# Patient Record
Sex: Female | Born: 1955 | Hispanic: No | Marital: Married | Smoking: Never smoker
Health system: Southern US, Community
[De-identification: ages and names within clinical notes are randomized; demographics above are authoritative.]

## PROBLEM LIST (undated history)

## (undated) DIAGNOSIS — J45909 Unspecified asthma, uncomplicated: Secondary | ICD-10-CM

## (undated) HISTORY — DX: Unspecified asthma, uncomplicated: J45.909

## (undated) HISTORY — PX: HEMORRHOID SURGERY: SHX153

---

## 2015-06-01 ENCOUNTER — Encounter (HOSPITAL_COMMUNITY): Payer: Self-pay | Admitting: Emergency Medicine

## 2015-06-01 ENCOUNTER — Emergency Department (HOSPITAL_COMMUNITY)
Admission: EM | Admit: 2015-06-01 | Discharge: 2015-06-01 | Disposition: A | Discharge status: Home / Self Care | Payer: PRIVATE HEALTH INSURANCE | Source: Home / Self Care | Attending: Family Medicine | Admitting: Family Medicine

## 2015-06-01 DIAGNOSIS — M25562 Pain in left knee: Secondary | ICD-10-CM

## 2015-06-01 DIAGNOSIS — R42 Dizziness and giddiness: Secondary | ICD-10-CM

## 2015-06-01 DIAGNOSIS — M25561 Pain in right knee: Secondary | ICD-10-CM

## 2015-06-01 LAB — POCT URINALYSIS DIP (DEVICE)
BILIRUBIN URINE: NEGATIVE
GLUCOSE, UA: NEGATIVE mg/dL
Hgb urine dipstick: NEGATIVE
Ketones, ur: NEGATIVE mg/dL
Leukocytes, UA: NEGATIVE
Nitrite: NEGATIVE
Protein, ur: NEGATIVE mg/dL
SPECIFIC GRAVITY, URINE: 1.02 (ref 1.005–1.030)
Urobilinogen, UA: 0.2 mg/dL (ref 0.0–1.0)
pH: 7 (ref 5.0–8.0)

## 2015-06-01 MED ORDER — MECLIZINE HCL 25 MG PO TABS: ORAL_TABLET | ORAL | Status: DC

## 2015-06-01 MED ORDER — ONDANSETRON HCL 4 MG PO TABS: 4.0000 mg | ORAL_TABLET | Freq: Four times a day (QID) | ORAL | Status: AC

## 2015-06-01 MED ORDER — NAPROXEN 375 MG PO TABS: 375.0000 mg | ORAL_TABLET | Freq: Two times a day (BID) | ORAL | Status: DC

## 2015-06-01 NOTE — Discharge Instructions (Signed)
Dizziness  Dizziness means you feel unsteady or lightheaded. You might feel like you are going to pass out (faint). HOME CARE   Drink enough fluids to keep your pee (urine) clear or pale yellow.  Take your medicines exactly as told by your doctor. If you take blood pressure medicine, always stand up slowly from the lying or sitting position. Hold on to something to steady yourself.  If you need to stand in one place for a long time, move your legs often. Tighten and relax your leg muscles.  Have someone stay with you until you feel okay.  Do not drive or use heavy machinery if you feel dizzy.  Do not drink alcohol. GET HELP RIGHT AWAY IF:   You feel dizzy or lightheaded and it gets worse.  You feel sick to your stomach (nauseous), or you throw up (vomit).  You have trouble talking or walking.  You feel weak or have trouble using your arms, hands, or legs.  You cannot think clearly or have trouble forming sentences.  You have chest pain, belly (abdominal) pain, sweating, or you are short of breath.  Your vision changes.  You are bleeding.  You have problems from your medicine that seem to be getting worse. MAKE SURE YOU:   Understand these instructions.  Will watch your condition.  Will get help right away if you are not doing well or get worse. Document Released: 11/16/2011 Document Revised: 02/19/2012 Document Reviewed: 11/16/2011 [facility] Patient Information 2015 [place], [place]. This information is not intended to replace advice given to you by your health care provider. Make sure you discuss any questions you have with your health care provider.  Knee Pain Knee pain can be a result of an injury or other medical conditions. Treatment will depend on the cause of your pain. HOME CARE  Only take medicine as told by your doctor.  Keep a healthy weight. Being overweight can make the knee hurt more.  Stretch before exercising or playing sports.  If there is constant  knee pain, change the way you exercise. Ask your doctor for advice.  Make sure shoes fit well. Choose the right shoe for the sport or activity.  Protect your knees. Wear kneepads if needed.  Rest when you are tired. GET HELP RIGHT AWAY IF:   Your knee pain does not stop.  Your knee pain does not get better.  Your knee joint feels hot to the touch.  You have a fever. MAKE SURE YOU:   Understand these instructions.  Will watch this condition.  Will get help right away if you are not doing well or get worse. Document Released: 02/23/2009 Document Revised: 02/19/2012 Document Reviewed: 02/23/2009 [facility] Patient Information 2015 [place], [place]. This information is not intended to replace advice given to you by your health care provider. Make sure you discuss any questions you have with your health care provider.

## 2015-06-01 NOTE — ED Notes (Signed)
Pt states that she has had back and knee pain for 3 years. [place] is from out [place] she has been in [place] since 05/03/2015. Pt also has ear pain and forgot the name of the medication she uses from her country.

## 2015-06-01 NOTE — ED Provider Notes (Signed)
CSN: [id number]     Arrival date & time 06/01/15  1303 History   First MD Initiated Contact with Patient 06/01/15 1348     Chief Complaint  Patient presents with  . Knee Pain  . Back Pain  . Otalgia   (Consider location/radiation/quality/duration/timing/severity/associated sxs/prior Treatment) HPI Comments: 59 year old female from the [place] arrived in the [place] approximately one month ago. She presents today for medicine refill for her dizziness and nausea that she has had for 3 years. She states that she has approximately 5 episodes in a year and the most recent occurrence was yesterday.  Her second complaint is that of bilateral knee pain for 5 years with a left greater than the right. 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 She denies back pain. Pain is worse with ambulation.   History reviewed. No pertinent past medical history. History reviewed. No pertinent past surgical history. History reviewed. No pertinent family history. History  Substance Use Topics  . Smoking status: Never Smoker   . Smokeless tobacco: Not on file  . Alcohol Use: No   OB History    No data available     Review of Systems  Constitutional: Negative for fever, activity change and fatigue.  HENT: Negative for congestion, ear pain, postnasal drip, rhinorrhea and sore throat.   Eyes: Negative.   Respiratory: Negative for cough and shortness of breath.   Cardiovascular: Negative for chest pain.  Gastrointestinal: Positive for nausea. Negative for vomiting and abdominal pain.  Musculoskeletal: Positive for arthralgias. Negative for back pain and joint swelling.  Skin: Negative.   Neurological: Positive for dizziness. Negative for tremors, syncope, light-headedness and numbness.    Allergies  Review of patient's allergies indicates no known allergies.  Home Medications   Prior to  Admission medications   Medication Sig Start Date End Date Taking? Authorizing Provider  meclizine (ANTIVERT) 25 MG tablet 1/2 tablet up to tid prn dizziness and nausea 06/01/15   Hayden Rasmussen, NP  naproxen (NAPROSYN) 375 MG tablet Take 1 tablet (375 mg total) by mouth 2 (two) times daily. As needed for knee pain. Take with food. 06/01/15   Hayden Rasmussen, NP   BP 140/90 mmHg  Pulse 85  Temp(Src) 97.7 F (36.5 C) (Oral)  Resp 14  SpO2 96% Physical Exam  Constitutional: She appears well-developed and well-nourished. No distress.  HENT:  Bilateral TMs are normal. Oropharynx is clear and without exudate.  Eyes: Conjunctivae and EOM are normal. Pupils are equal, round, and reactive to light.  Neck: Normal range of motion. Neck supple.  Cardiovascular: Normal rate, regular rhythm and normal heart sounds.   Pulmonary/Chest: Effort normal and breath sounds normal. No respiratory distress.  Musculoskeletal:  Bilateral knee exam reveals no swelling, discoloration or deformities. She has full range of motion with extension and flexion. No bony or other tissue. No apparent effusion. No Tenderness along the joint line.  Lymphadenopathy:    She has no cervical adenopathy.  Neurological: She is alert. She exhibits normal muscle tone.  Skin: Skin is warm and dry.  Psychiatric: She has a normal mood and affect.  Nursing note and vitals reviewed.   ED Course  Procedures (including critical care time) Labs Review Labs Reviewed  POCT URINALYSIS DIP (DEVICE)    Imaging Review No results found.   MDM   1. Dizziness   2. Bilateral knee pain    Meclizine  25 mg one half tablet up to 3 times a day when necessary dizziness Naprosyn 375 mg every 12 hours when necessary knee pain. Take with food. Follow-up with [facility] and keep appointment next month. Zofran 4 mg 3 times a day when necessary nausea    Hayden Rasmussen, NP 06/01/15 1407

## 2015-07-07 ENCOUNTER — Ambulatory Visit (INDEPENDENT_AMBULATORY_CARE_PROVIDER_SITE_OTHER): Payer: PRIVATE HEALTH INSURANCE | Admitting: Family Medicine

## 2015-07-07 ENCOUNTER — Encounter: Payer: Self-pay | Admitting: Family Medicine

## 2015-07-07 ENCOUNTER — Other Ambulatory Visit: Payer: Self-pay

## 2015-07-07 VITALS — BP 158/94 | HR 76 | Temp 97.9°F | Resp 14 | Ht 64.0 in | Wt 182.0 lb

## 2015-07-07 DIAGNOSIS — G8929 Other chronic pain: Secondary | ICD-10-CM

## 2015-07-07 DIAGNOSIS — Z7689 Persons encountering health services in other specified circumstances: Secondary | ICD-10-CM | POA: Insufficient documentation

## 2015-07-07 DIAGNOSIS — M2550 Pain in unspecified joint: Secondary | ICD-10-CM

## 2015-07-07 DIAGNOSIS — Z0000 Encounter for general adult medical examination without abnormal findings: Secondary | ICD-10-CM

## 2015-07-07 DIAGNOSIS — R42 Dizziness and giddiness: Secondary | ICD-10-CM | POA: Insufficient documentation

## 2015-07-07 DIAGNOSIS — Z7189 Other specified counseling: Secondary | ICD-10-CM

## 2015-07-07 LAB — CBC WITH DIFFERENTIAL/PLATELET
Basophils Absolute: 0.1 10*3/uL (ref 0.0–0.1)
Basophils Relative: 1 % (ref 0–1)
EOS ABS: 0.5 10*3/uL (ref 0.0–0.7)
EOS PCT: 9 % — AB (ref 0–5)
HCT: 42.2 % (ref 36.0–46.0)
HEMOGLOBIN: 14.3 g/dL (ref 12.0–15.0)
LYMPHS PCT: 47 % — AB (ref 12–46)
Lymphs Abs: 2.5 10*3/uL (ref 0.7–4.0)
MCH: 30 pg (ref 26.0–34.0)
MCHC: 33.9 g/dL (ref 30.0–36.0)
MCV: 88.7 fL (ref 78.0–100.0)
MONO ABS: 0.3 10*3/uL (ref 0.1–1.0)
MPV: 10.7 fL (ref 8.6–12.4)
Monocytes Relative: 6 % (ref 3–12)
Neutro Abs: 2 10*3/uL (ref 1.7–7.7)
Neutrophils Relative %: 37 % — ABNORMAL LOW (ref 43–77)
Platelets: 308 10*3/uL (ref 150–400)
RBC: 4.76 MIL/uL (ref 3.87–5.11)
RDW: 12.9 % (ref 11.5–15.5)
WBC: 5.4 10*3/uL (ref 4.0–10.5)

## 2015-07-07 MED ORDER — MECLIZINE HCL 25 MG PO TABS: ORAL_TABLET | ORAL | Status: AC

## 2015-07-07 MED ORDER — NAPROXEN 375 MG PO TABS: 375.0000 mg | ORAL_TABLET | Freq: Two times a day (BID) | ORAL | Status: DC

## 2015-07-07 MED ORDER — NAPROXEN 375 MG PO TABS: 375.0000 mg | ORAL_TABLET | Freq: Two times a day (BID) | ORAL | Status: AC

## 2015-07-07 MED ORDER — MECLIZINE HCL 25 MG PO TABS
ORAL_TABLET | ORAL | Status: DC
Start: 2015-07-07 — End: 2015-07-07

## 2015-07-07 MED ORDER — BUDESONIDE-FORMOTEROL FUMARATE 160-4.5 MCG/ACT IN AERO: 2.0000 | INHALATION_SPRAY | Freq: Two times a day (BID) | RESPIRATORY_TRACT | Status: AC

## 2015-07-07 NOTE — Progress Notes (Signed)
Patient ID: Haley Wallace, female   DOB: 16-Jul-1956, 59 y.o.   MRN: [id number]   Haley Wallace, is a 59 y.o. female  [id number]  [id number]  DOB - [birth year]  CC:  Chief Complaint  Patient presents with  . Establish Care    asthma, pain in back and knees, rash on skin        HPI: Haley Wallace is a 59 y.o. female here to establish care and receive treatment for chronic back and knee pain. occassional dizziness and asthma. She was seen at urgent care recently and was prescribed naproxen for the musculoskeletal pain and is finding that it is helping with the pain. She reports having intermittent dizziness with head movement. This has been a long term problem. She uses Meclizine to control prn. She is on Symbicort bid for asthma control. She has a history of kidney stones. Otherwise she denies chronic illnesses. Denies a history of hypertension, diabetes, cancer, hyperlipidemia.  No Known Allergies Past Medical History  Diagnosis Date  . Asthma    Current Outpatient Prescriptions on File Prior to Visit  Medication Sig Dispense Refill  . meclizine (ANTIVERT) 25 MG tablet 1/2 tablet up to tid prn dizziness and nausea (Patient not taking: Reported on 07/07/2015) 20 tablet 0  . naproxen (NAPROSYN) 375 MG tablet Take 1 tablet (375 mg total) by mouth 2 (two) times daily. As needed for knee pain. Take with food. (Patient not taking: Reported on 07/07/2015) 20 tablet 0  . ondansetron (ZOFRAN) 4 MG tablet Take 1 tablet (4 mg total) by mouth every 6 (six) hours. (Patient not taking: Reported on 07/07/2015) 12 tablet 0   No current facility-administered medications on file prior to visit.   Family History  Problem Relation Age of Onset  . Cancer Sister    History   Social History  . Marital Status: Married    Spouse Name: N/A  . Number of Children: N/A  . Years of Education: N/A   Occupational History  . Not on file.   Social History Main Topics  . Smoking status: Never Smoker   .  Smokeless tobacco: Never Used  . Alcohol Use: No  . Drug Use: No  . Sexual Activity: Not on file   Other Topics Concern  . Not on file   Social History Narrative    Review of Systems: Constitutional: Negative for fever, chills, appetite change, weight loss. Positive for fatigue. Skin: positive for intermittent rash for last 3 years. Last episode 4 months ago. HENT: Negative  ear discharge.nose bleeds. Positive for intermittent ear discomfort. Eyes: Negative for pain, discharge, redness, itching and visual disturbance.Positive for frequent tearing. Wears glasses Neck: Negative for pain, stiffness Respiratory: Positive for cough, shortness of breath,  Wheezing related to asthma. Use symbicort bid Cardiovascular: Negative for chest pain, palpitations and leg swelling. Gastrointestinal: Negative for abdominal distention, abdominal pain, nausea, vomiting, diarrhea, constipations Genitourinary: Negative for dysuria, urgency, frequency, hematuria, flank pain,  Musculoskeletal: Positive for back pain, knee pain, arthralgia and Negative for weakness. Neurological: Negative for tremors, seizures, syncope,   light-headedness, numbness and headaches. Positive for intermittent dizziness with head movement, use Meclizine Hematological: Negative for easy bruising or bleeding Psychiatric/Behavioral: Negative for depression, anxiety, decreased concentration, confusion    Objective:   Filed Vitals:   07/07/15 1030  BP: 151/81  Pulse: 76  Temp: 97.9 F (36.6 C)  Resp: 14    Physical Exam: Constitutional: Patient appears well-developed and well-nourished. No distress. HENT: Normocephalic, atraumatic, External right and  left ear normal. Oropharynx is clear and moist.  Eyes: Conjunctivae and EOM are normal. PERRLA, no scleral icterus. Neck: Normal ROM. Neck supple. No lymphadenopathy, No thyromegaly. CVS: RRR, S1/S2 +, no murmurs, no gallops, no rubs Pulmonary: Effort and breath sounds normal,  no stridor, rhonchi, wheezes, rales.  Abdominal: Soft. Normoactive BS,, no distension, tenderness, rebound or guarding.  Musculoskeletal: Normal range of motion. No edema and no tenderness.  Neuro: Alert.Normal muscle tone coordination. Non-focal Skin: Skin is warm and dry. No rash noted. Not diaphoretic. No erythema. No pallor. Psychiatric: Normal mood and affect. Behavior, judgment, thought content normal.  No results found for: WBC, HGB, HCT, MCV, PLT No results found for: CREATININE, BUN, NA, K, CL, CO2  No results found for: HGBA1C Lipid Panel  No results found for: CHOL, TRIG, HDL, CHOLHDL, VLDL, LDLCALC     Assessment and plan:   Visit to establish care -Review of PFSH -review of records from urgent care -Cmet with GFR, CBC, lipid panel, TSH. Will call if shows anything needing treatment. -discussion of needed health maintenance. Will have her return for further discussion of these issues  Dizziness -Refill of Meclizine 25 mg #30 to be used 1/2-1 prn prolonged dizziness  Back and knee pain, chronic, improving with naproxen -Refill of naproxen 325 mg #60 one pos bid with food.  Elevated blood pressure, 151/94 today, 140/90 in urgent care within the last month. -Will have her return in one week for nurse visit for BP check  Follow-up one week for BP check with nurse. Follow-up in one-2 months for further discussion of health maintenance and PAP.  The patient was given clear instructions to go to ER or return to medical center if symptoms don't improve, worsen or new problems develop. The patient verbalized understanding. The patient was told to call to get lab results if they haven't heard anything in the next week.       Henrietta Hoover, MSN, FNP-BC   07/07/2015, 10:43 AM

## 2015-07-07 NOTE — Telephone Encounter (Signed)
Re-sent medications into correct pharmacy. Thanks!

## 2015-07-07 NOTE — Patient Instructions (Signed)
Continue medications as they are I have provided refills on symbicort, naproxen and meclizine today. We will call if there is anything needing treatment in your bloodwork Follow-up in six months and as needed.

## 2015-07-08 LAB — COMPLETE METABOLIC PANEL WITH GFR
ALBUMIN: 4.2 g/dL (ref 3.6–5.1)
ALT: 16 U/L (ref 6–29)
AST: 16 U/L (ref 10–35)
Alkaline Phosphatase: 62 U/L (ref 33–130)
BILIRUBIN TOTAL: 0.4 mg/dL (ref 0.2–1.2)
BUN: 9 mg/dL (ref 7–25)
CALCIUM: 9.5 mg/dL (ref 8.6–10.4)
CHLORIDE: 100 meq/L (ref 98–110)
CO2: 23 meq/L (ref 20–31)
Creat: 0.66 mg/dL (ref 0.50–0.99)
GFR, Est African American: >89 mL/min (ref >=60)
GFR, Est Non African American: >89 mL/min (ref >=60)
GLUCOSE: 90 mg/dL (ref 65–99)
POTASSIUM: 4.6 meq/L (ref 3.5–5.3)
SODIUM: 140 meq/L (ref 135–146)
TOTAL PROTEIN: 7.2 g/dL (ref 6.1–8.1)

## 2015-07-08 LAB — LIPID PANEL
CHOL/HDL RATIO: 5.8 ratio — AB (ref <=5.0)
Cholesterol: 231 mg/dL — ABNORMAL HIGH (ref 125–200)
HDL: 40 mg/dL — AB (ref >=46)
LDL CALC: 150 mg/dL — AB (ref <130)
Triglycerides: 204 mg/dL — ABNORMAL HIGH (ref <150)
VLDL: 41 mg/dL — AB (ref <30)

## 2015-07-08 LAB — TSH: TSH: 0.582 u[IU]/mL (ref 0.350–4.500)

## 2015-07-14 ENCOUNTER — Other Ambulatory Visit: Payer: PRIVATE HEALTH INSURANCE

## 2015-09-22 ENCOUNTER — Encounter (HOSPITAL_COMMUNITY): Payer: Self-pay | Admitting: Emergency Medicine

## 2015-09-22 ENCOUNTER — Emergency Department (HOSPITAL_COMMUNITY): Payer: PRIVATE HEALTH INSURANCE

## 2015-09-22 ENCOUNTER — Emergency Department (HOSPITAL_COMMUNITY)
Admission: EM | Admit: 2015-09-22 | Discharge: 2015-09-22 | Disposition: A | Discharge status: Home / Self Care | Payer: PRIVATE HEALTH INSURANCE | Attending: Emergency Medicine | Admitting: Emergency Medicine

## 2015-09-22 DIAGNOSIS — Y9389 Activity, other specified: Secondary | ICD-10-CM | POA: Insufficient documentation

## 2015-09-22 DIAGNOSIS — S52592A Other fractures of lower end of left radius, initial encounter for closed fracture: Secondary | ICD-10-CM | POA: Insufficient documentation

## 2015-09-22 DIAGNOSIS — Y92000 Kitchen of unspecified non-institutional (private) residence as  the place of occurrence of the external cause: Secondary | ICD-10-CM | POA: Insufficient documentation

## 2015-09-22 DIAGNOSIS — J45909 Unspecified asthma, uncomplicated: Secondary | ICD-10-CM | POA: Insufficient documentation

## 2015-09-22 DIAGNOSIS — Z791 Long term (current) use of non-steroidal anti-inflammatories (NSAID): Secondary | ICD-10-CM | POA: Insufficient documentation

## 2015-09-22 DIAGNOSIS — S52502A Unspecified fracture of the lower end of left radius, initial encounter for closed fracture: Secondary | ICD-10-CM

## 2015-09-22 DIAGNOSIS — W010XXA Fall on same level from slipping, tripping and stumbling without subsequent striking against object, initial encounter: Secondary | ICD-10-CM | POA: Insufficient documentation

## 2015-09-22 DIAGNOSIS — Y998 Other external cause status: Secondary | ICD-10-CM | POA: Insufficient documentation

## 2015-09-22 MED ORDER — HYDROCODONE-ACETAMINOPHEN 5-325 MG PO TABS: 1.0000 | ORAL_TABLET | Freq: Four times a day (QID) | ORAL | Status: AC | PRN

## 2015-09-22 MED ORDER — HYDROCODONE-ACETAMINOPHEN 5-325 MG PO TABS
1.0000 | ORAL_TABLET | Freq: Once | ORAL | Status: AC
  Administered 2015-09-22: 1 via ORAL
  Filled 2015-09-22: qty 1

## 2015-09-22 NOTE — ED Provider Notes (Signed)
CSN: [id number]     Arrival date & time 09/22/15  2030 History  By signing my name below, I, Budd Palmer, attest that this documentation has been prepared under the direction and in the presence of [employer], PA-C. Electronically Signed: Budd Palmer, ED Scribe. 09/22/2015. 9:45 PM.     Chief Complaint  Patient presents with  . Wrist Pain   The history is provided by the patient and a relative. No language interpreter was used.   HPI Comments: Haley Wallace is a 59 y.o. female who presents to the Emergency Department complaining of constant, aching, left wrist pain onset after slipping on the kitchen floor and landing on her wrist just PTA. She reports associated swelling. Per relative, pt denies any other injuries.  Past Medical History  Diagnosis Date  . Asthma    Past Surgical History  Procedure Laterality Date  . Hemorrhoid surgery     Family History  Problem Relation Age of Onset  . Cancer Sister    Social History  Substance Use Topics  . Smoking status: Never Smoker   . Smokeless tobacco: Never Used  . Alcohol Use: No   OB History    No data available     Review of Systems  Constitutional: Negative for fever.  Musculoskeletal: Positive for joint swelling and arthralgias.  Skin: Negative for wound.    Allergies  Review of patient's allergies indicates no known allergies.  Home Medications   Prior to Admission medications   Medication Sig Start Date End Date Taking? Authorizing Provider  budesonide-formoterol (SYMBICORT) 160-4.5 MCG/ACT inhaler Inhale 2 puffs into the lungs 2 (two) times daily. 07/07/15   Henrietta Hoover, NP  meclizine (ANTIVERT) 25 MG tablet 1/2 tablet up to tid prn dizziness and nausea 07/07/15   Henrietta Hoover, NP  naproxen (NAPROSYN) 375 MG tablet Take 1 tablet (375 mg total) by mouth 2 (two) times daily. As needed for knee pain. Take with food. 07/07/15   Henrietta Hoover, NP  ondansetron (ZOFRAN) 4 MG tablet Take 1 tablet  (4 mg total) by mouth every 6 (six) hours. Patient not taking: Reported on 07/07/2015 06/01/15   Hayden Rasmussen, NP   BP 120/68 mmHg  Pulse 82  Temp(Src) 98.1 F (36.7 C) (Oral)  Resp 14  SpO2 100% Physical Exam  Constitutional: She is oriented to person, place, and time. She appears well-developed and well-nourished.  HENT:  Head: Normocephalic.  Eyes: EOM are normal.  Neck: Normal range of motion.  Pulmonary/Chest: Effort normal.  Abdominal: She exhibits no distension.  Musculoskeletal: Normal range of motion.  Swelling and deformity noted to the left dorsal wrist. ttp over medial aspect of the wrist. Normal hand exam. Pain with any rom. Radial pulse intact. Normal elbow exam. Normal fingers  Neurological: She is alert and oriented to person, place, and time.  Psychiatric: She has a normal mood and affect.  Nursing note and vitals reviewed.   ED Course  Procedures  DIAGNOSTIC STUDIES: Oxygen Saturation is 100% on RA, normal by my interpretation.    COORDINATION OF CARE: 9:43 PM - Discussed XR results of wrist fracture. Discussed plans to splint the wrist and order pain medication. Will refer to an orthopedist. Pt advised of plan for treatment and pt agrees.  Labs Review Labs Reviewed - No data to display  Imaging Review Dg Wrist Complete Left  09/22/2015  CLINICAL DATA:  59 year old female with acute left wrist pain following fall today. Initial encounter. EXAM: LEFT WRIST -  COMPLETE 3+ VIEW COMPARISON:  None. FINDINGS: A comminuted distal radial fracture is identified with probable intra-articular extension. There is no evidence of subluxation or dislocation. No other fractures are identified. Soft tissue swelling is present. IMPRESSION: Comminuted distal radial fracture with probable intra-articular extension. No evidence of subluxation or dislocation. Electronically Signed   By: Harmon PierJeffrey  Hu M.D.   On: 09/22/2015 21:31   I have personally reviewed and evaluated these images and  lab results as part of my medical decision-making.   EKG Interpretation None      MDM   Final diagnoses:  Distal radius fracture, left, closed, initial encounter    patient with left wrist injury. No elbow or hand injury. X-rays show a comminuted distal radial fracture. Neurovascularly intact. Patient placed in volar splint.  discuss treatment plan with Dr. Freida BusmanAllen who agrees, home with pain management and orthopedics follow-up.   Filed Vitals:   09/22/15 2046 09/22/15 2237  BP: 120/68 122/67  Pulse: 82 83  Temp: 98.1 F (36.7 C) 98.3 F (36.8 C)  TempSrc: Oral Oral  Resp: 14 16  SpO2: 100% 99%    I personally performed the services described in this documentation, which was scribed in my presence. The recorded information has been reviewed and is accurate.   Jaynie Crumbleatyana Yaron Grasse, PA-C 09/22/15 2314  Lorre NickAnthony Allen, MD 09/22/15 2317

## 2015-09-22 NOTE — Discharge Instructions (Signed)
Ibuprofen or Tylenol for pain. Norco for severe pain only. Ice and elevate the wrist. Keep splint on at all times. Follow-up with orthopedic specialist as referred.  Wrist Fracture A wrist fracture is a break or crack in one of the bones of your wrist. Your wrist is made up of eight small bones at the palm of your hand (carpal bones) and two long bones that make up your forearm (radius and ulna). CAUSES  A direct blow to the wrist.  Falling on an outstretched hand.  Trauma, such as a car accident or a fall. RISK FACTORS Risk factors for wrist fracture include:  Participating in contact and high-risk sports, such as skiing, biking, and ice skating.  Taking steroid medicines.  Smoking.  Being female.  Being Caucasian.  Drinking more than three alcoholic beverages per day.  Having low or lowered bone density (osteoporosis or osteopenia).  Age. Older adults have decreased bone density.  Women who have had menopause.  History of previous fractures. SIGNS AND SYMPTOMS Symptoms of wrist fractures include tenderness, bruising, and inflammation. Additionally, the wrist may hang in an odd position or appear deformed. DIAGNOSIS Diagnosis may include:  Physical exam.  X-ray. TREATMENT Treatment depends on many factors, including the nature and location of the fracture, your age, and your activity level. Treatment for wrist fracture can be nonsurgical or surgical. Nonsurgical Treatment A plaster cast or splint may be applied to your wrist if the bone is in a good position. If the fracture is not in good position, it may be necessary for your health care provider to realign it before applying a splint or cast. Usually, a cast or splint will be worn for several weeks. Surgical Treatment Sometimes the position of the bone is so far out of place that surgery is required to apply a device to hold it together as it heals. Depending on the fracture, there are a number of options for holding  the bone in place while it heals, such as a cast and metal pins. HOME CARE INSTRUCTIONS  Keep your injured wrist elevated and move your fingers as much as possible.  Do not put pressure on any part of your cast or splint. It may break.  Use a plastic bag to protect your cast or splint from water while bathing or showering. Do not lower your cast or splint into water.  Take medicines only as directed by your health care provider.  Keep your cast or splint clean and dry. If it becomes wet, damaged, or suddenly feels too tight, contact your health care provider right away.  Do not use any tobacco products including cigarettes, chewing tobacco, or electronic cigarettes. Tobacco can delay bone healing. If you need help quitting, ask your health care provider.  Keep all follow-up visits as directed by your health care provider. This is important.  Ask your health care provider if you should take supplements of calcium and vitamins C and D to promote bone healing. SEEK MEDICAL CARE IF:  Your cast or splint is damaged, breaks, or gets wet.  You have a fever.  You have chills.  You have continued severe pain or more swelling than you did before the cast was put on. SEEK IMMEDIATE MEDICAL CARE IF:  Your hand or fingernails on the injured arm turn blue or gray, or feel cold or numb.  You have decreased feeling in the fingers of your injured arm. MAKE SURE YOU:  Understand these instructions.  Will watch your condition.  Will  get help right away if you are not doing well or get worse.   This information is not intended to replace advice given to you by your health care provider. Make sure you discuss any questions you have with your health care provider.   Document Released: 09/06/2005 Document Revised: 08/18/2015 Document Reviewed: 12/15/2011 Elsevier Interactive Patient Education [employer].

## 2015-09-22 NOTE — ED Notes (Signed)
Ortho tech paged for arm splint.

## 2015-09-22 NOTE — Progress Notes (Signed)
Orthopedic Tech Progress Note Patient Details:  Elesa HackerHikmat Chestang 01/08/1956 [id number]  Ortho Devices Type of Ortho Device: Ace wrap, Volar splint Ortho Device/Splint Location: LUE Ortho Device/Splint Interventions: Ordered, Application   Jennye MoccasinHughes, Pura Picinich Craig 09/22/2015, 10:17 PM

## 2015-09-22 NOTE — ED Notes (Addendum)
C/o L wrist pain and swelling.  Family member states she slipped in kitchen earlier tonight.  CMS intact.  Denies LOC.  Denies neck and back pain.

## 2015-10-15 ENCOUNTER — Encounter (HOSPITAL_COMMUNITY): Payer: Self-pay | Admitting: *Deleted

## 2015-10-15 ENCOUNTER — Emergency Department (HOSPITAL_COMMUNITY)
Admission: EM | Admit: 2015-10-15 | Discharge: 2015-10-15 | Disposition: A | Discharge status: Home / Self Care | Payer: PRIVATE HEALTH INSURANCE | Attending: Emergency Medicine | Admitting: Emergency Medicine

## 2015-10-15 DIAGNOSIS — R110 Nausea: Secondary | ICD-10-CM | POA: Insufficient documentation

## 2015-10-15 DIAGNOSIS — S52592D Other fractures of lower end of left radius, subsequent encounter for closed fracture with routine healing: Secondary | ICD-10-CM | POA: Insufficient documentation

## 2015-10-15 DIAGNOSIS — M25532 Pain in left wrist: Secondary | ICD-10-CM

## 2015-10-15 DIAGNOSIS — J45909 Unspecified asthma, uncomplicated: Secondary | ICD-10-CM | POA: Insufficient documentation

## 2015-10-15 DIAGNOSIS — S52502D Unspecified fracture of the lower end of left radius, subsequent encounter for closed fracture with routine healing: Secondary | ICD-10-CM

## 2015-10-15 DIAGNOSIS — Z7951 Long term (current) use of inhaled steroids: Secondary | ICD-10-CM | POA: Insufficient documentation

## 2015-10-15 DIAGNOSIS — X58XXXD Exposure to other specified factors, subsequent encounter: Secondary | ICD-10-CM | POA: Insufficient documentation

## 2015-10-15 MED ORDER — ONDANSETRON HCL 8 MG PO TABS: 8.0000 mg | ORAL_TABLET | Freq: Three times a day (TID) | ORAL | Status: AC | PRN

## 2015-10-15 MED ORDER — HYDROCODONE-ACETAMINOPHEN 5-325 MG PO TABS: 1.0000 | ORAL_TABLET | Freq: Four times a day (QID) | ORAL | Status: AC | PRN

## 2015-10-15 MED ORDER — ONDANSETRON 4 MG PO TBDP
8.0000 mg | ORAL_TABLET | Freq: Once | ORAL | Status: AC
  Administered 2015-10-15: 8 mg via ORAL
  Filled 2015-10-15: qty 2

## 2015-10-15 MED ORDER — NAPROXEN 500 MG PO TABS: 500.0000 mg | ORAL_TABLET | Freq: Two times a day (BID) | ORAL | Status: AC | PRN

## 2015-10-15 NOTE — ED Notes (Signed)
Per pt and family, pt was here on 10/12 for injury to left wrist. Pt had splint applied and referred to ortho. Pt went to clinic instead and was told to keep splint on and return to ED if any increase in pain or swelling. Reports increase in pain for several days and no relief with pains meds prescribed.

## 2015-10-15 NOTE — Discharge Instructions (Signed)
Wear wrist splint until you see the orthopedist. Ice and elevate wrist throughout the day. Alternate between naprosyn and norco for pain relief. Do not drive or operate machinery with pain medication use. Use zofran as needed for nausea. Call orthopedic follow up today or tomorrow to schedule followup appointment for ongoing management of your wrist fracture. Return to the ER for changes or worsening symptoms.    Wrist Pain There are many things that can cause wrist pain. Some common causes include:  An injury to the wrist area.  Overuse of the joint.  A condition that causes too much pressure to be put on a nerve in the wrist (carpal tunnel syndrome).  Wear and tear of the joints that happens as a person gets older (osteoarthritis).  Other types of arthritis. Sometimes, the cause is not known. The pain often goes away when you follow instructions from your doctor about relieving pain at home. If your wrist pain does not go away, tests may need to be done to find the cause. HOME CARE Pay attention to any changes in your symptoms. Take these actions to help with your pain:  Rest your wrist for at least 48 hours or as told by your doctor.  If your doctor tells you to, put ice on the injured area:  Put ice in a plastic bag.  Place a towel between your skin and the bag.  Leave the ice on for 20 minutes, 2-3 times per day.  Keep your arm raised (elevated) above the level of your heart while you are sitting or lying down.  If a splint or elastic bandage has been put on the injured area:  Wear it as told by your doctor.  Take the splint or bandage off only as told by your doctor.  Loosen the splint or bandage if your fingers lose feeling (are numb) or have a tingling feeling, or if they turn cold or blue.  Take over-the-counter and prescription medicines only as told by your doctor.  Keep all follow-up visits as told by your doctor. This is important. GET HELP IF:  Your pain is  not helped by treatment.  Your pain gets worse. GET HELP RIGHT AWAY IF:   Your fingers swell.  Your fingers turn white, very red, or cold and blue.  Your fingers lose feeling or have a tingling feeling.  You have trouble moving your fingers.   This information is not intended to replace advice given to you by your health care provider. Make sure you discuss any questions you have with your health care provider.   Document Released: 05/15/2008 Document Revised: 08/18/2015 Document Reviewed: 04/14/2015 Elsevier Interactive Patient Education 2016 [employer]  Nausea, Adult Nausea means you feel sick to your stomach or need to throw up (vomit). It may be a sign of a more serious problem. If nausea gets worse, you may throw up. If you throw up a lot, you may lose too much body fluid (dehydration). HOME CARE   Get plenty of rest.  Ask your doctor how to replace body fluid losses (rehydrate).  Eat small amounts of food. Sip liquids more often.  Take all medicines as told by your doctor. GET HELP RIGHT AWAY IF:  You have a fever.  You pass out (faint).  You keep throwing up or have blood in your throw up.  You are very weak, have dry lips or a dry mouth, or you are very thirsty (dehydrated).  You have dark or bloody poop (stool).  You have very bad chest or belly (abdominal) pain.  You do not get better after 2 days, or you get worse.  You have a headache. MAKE SURE YOU:  Understand these instructions.  Will watch your condition.  Will get help right away if you are not doing well or get worse.   This information is not intended to replace advice given to you by your health care provider. Make sure you discuss any questions you have with your health care provider.   Document Released: 11/16/2011 Document Revised: 02/19/2012 Document Reviewed: 11/16/2011 Elsevier Interactive Patient Education 2016 [employer] Inc[employer]  Cryotherapy Cryotherapy is when you put ice on  your injury. Ice helps lessen pain and puffiness (swelling) after an injury. Ice works the best when you start using it in the first 24 to 48 hours after an injury. HOME CARE  Put a dry or damp towel between the ice pack and your skin.  You may press gently on the ice pack.  Leave the ice on for no more than 10 to 20 minutes at a time.  Check your skin after 5 minutes to make sure your skin is okay.  Rest at least 20 minutes between ice pack uses.  Stop using ice when your skin loses feeling (numbness).  Do not use ice on someone who cannot tell you when it hurts. This includes small children and people with memory problems (dementia). GET HELP RIGHT AWAY IF:  You have white spots on your skin.  Your skin turns blue or pale.  Your skin feels waxy or hard.  Your puffiness gets worse. MAKE SURE YOU:   Understand these instructions.  Will watch your condition.  Will get help right away if you are not doing well or get worse.   This information is not intended to replace advice given to you by your health care provider. Make sure you discuss any questions you have with your health care provider.   Document Released: 05/15/2008 Document Revised: 02/19/2012 Document Reviewed: 07/20/2011 Elsevier Interactive Patient Education [employer]

## 2015-10-15 NOTE — Progress Notes (Signed)
Orthopedic Tech Progress Note Patient Details:  Haley Wallace 07/06/1956 [id number]  Ortho Devices Type of Ortho Device: Ace wrap, Arm sling, Sugartong splint Ortho Device/Splint Location: LUE Ortho Device/Splint Interventions: Ordered, Application   Jennye MoccasinHughes, [name] 10/15/2015, 4:45 PM

## 2015-10-15 NOTE — ED Provider Notes (Signed)
CSN: [id number]     Arrival date & time 10/15/15  1433 History   First MD Initiated Contact with Patient 10/15/15 1609     Chief Complaint  Patient presents with  . Hand Pain     (Consider location/radiation/quality/duration/timing/severity/associated sxs/prior Treatment) HPI Comments: Haley Wallace is a 59 y.o. female with a PMHx of asthma, who presents to the ED with complaints of ongoing left wrist pain since she slipped and fell on her kitchen floor on 09/22/15. Patient's son provides most of the history and translates for the patient. He reports that after they were discharged, they were told to follow-up with an orthopedist, but due to the fact that the patient has no insurance and has a orange card, they went to her regular doctor to get a referral to another orthopedist, and when they called.orthopedic doctor they were told that there were no appointments until January. Patient's son reports that the patient has been complaining of pain since the injury. She is present pain is 7/10 constant sharp left wrist pain nonradiating worse with pronation and supination and improved with the norco given that her last ER visit, but she has run out of this medication. She reports some mild nausea that comes with the pain.  She denies any fevers, chills, chest pain, shortness breath, abdominal pain, vomiting, diarrhea, constipation, dysuria, hematuria, numbness, tingling, weakness, or worsening swelling. She denies any skin changes.  Patient is a 59 y.o. female presenting with hand pain. The history is provided by the patient. A language interpreter was used (pt's son).  Hand Pain This is a recurrent problem. The current episode started 1 to 4 weeks ago. The problem occurs constantly. The problem has been unchanged. Associated symptoms include arthralgias (L wrist) and nausea (due to pain). Pertinent negatives include no abdominal pain, chest pain, chills, fever, joint swelling, myalgias, numbness,  vomiting or weakness. Exacerbated by: supination of wrist. She has tried oral narcotics for the symptoms. The treatment provided moderate relief.    Past Medical History  Diagnosis Date  . Asthma    Past Surgical History  Procedure Laterality Date  . Hemorrhoid surgery     Family History  Problem Relation Age of Onset  . Cancer Sister    Social History  Substance Use Topics  . Smoking status: Never Smoker   . Smokeless tobacco: Never Used  . Alcohol Use: No   OB History    No data available     Review of Systems  Constitutional: Negative for fever and chills.  Respiratory: Negative for shortness of breath.   Cardiovascular: Negative for chest pain.  Gastrointestinal: Positive for nausea (due to pain). Negative for vomiting, abdominal pain, diarrhea and constipation.  Genitourinary: Negative for dysuria and hematuria.  Musculoskeletal: Positive for arthralgias (L wrist). Negative for myalgias and joint swelling.  Skin: Negative for color change.  Allergic/Immunologic: Negative for immunocompromised state.  Neurological: Negative for weakness and numbness.  Psychiatric/Behavioral: Negative for confusion.   10 Systems reviewed and are negative for acute change except as noted in the HPI.    Allergies  Review of patient's allergies indicates no known allergies.  Home Medications   Prior to Admission medications   Medication Sig Start Date End Date Taking? Authorizing Provider  budesonide-formoterol (SYMBICORT) 160-4.5 MCG/ACT inhaler Inhale 2 puffs into the lungs 2 (two) times daily. 07/07/15   Henrietta Hoover, NP  HYDROcodone-acetaminophen (NORCO) 5-325 MG tablet Take 1 tablet by mouth every 6 (six) hours as needed for moderate pain.  09/22/15   Tatyana Kirichenko, PA-C  meclizine (ANTIVERT) 25 MG tablet 1/2 tablet up to tid prn dizziness and nausea 07/07/15   Henrietta HooverLinda C Bernhardt, NP  naproxen (NAPROSYN) 375 MG tablet Take 1 tablet (375 mg total) by mouth 2 (two) times  daily. As needed for knee pain. Take with food. 07/07/15   Henrietta HooverLinda C Bernhardt, NP  ondansetron (ZOFRAN) 4 MG tablet Take 1 tablet (4 mg total) by mouth every 6 (six) hours. Patient not taking: Reported on 07/07/2015 06/01/15   Hayden Rasmussenavid Mabe, NP   BP 120/70 mmHg  Pulse 87  Temp(Src) 98.1 F (36.7 C) (Oral)  Resp 18  SpO2 99% Physical Exam  Constitutional: She is oriented to person, place, and time. Vital signs are normal. She appears well-developed and well-nourished.  Non-toxic appearance. No distress.  Afebrile, nontoxic, NAD  HENT:  Head: Normocephalic and atraumatic.  Mouth/Throat: Oropharynx is clear and moist and mucous membranes are normal.  Eyes: Conjunctivae and EOM are normal. Right eye exhibits no discharge. Left eye exhibits no discharge.  Neck: Normal range of motion. Neck supple.  Cardiovascular: Normal rate, regular rhythm, normal heart sounds and intact distal pulses.  Exam reveals no gallop and no friction rub.   No murmur heard. Pulmonary/Chest: Effort normal and breath sounds normal. No respiratory distress. She has no decreased breath sounds. She has no wheezes. She has no rhonchi. She has no rales.  Abdominal: Soft. Normal appearance and bowel sounds are normal. She exhibits no distension. There is no tenderness. There is no rigidity, no rebound, no guarding, no CVA tenderness, no tenderness at McBurney's point and negative Murphy's sign.  Musculoskeletal:       Left wrist: She exhibits decreased range of motion (due to pain). She exhibits no tenderness, no bony tenderness, no swelling, no deformity and no laceration.  L wrist with minimally decreased ROM due to pain particularly with supination, with no focal areas of TTP, no swelling or bruising, no deformity, skin intact, with strength and sensation grossly intact, distal pulses intact. Wiggles all digits with ease.   Neurological: She is alert and oriented to person, place, and time. She has normal strength. No sensory  deficit.  Skin: Skin is warm, dry and intact. No rash noted.  Psychiatric: She has a normal mood and affect.  Nursing note and vitals reviewed.   ED Course  Procedures (including critical care time) Labs Review Labs Reviewed - No data to display  Imaging Review No results found.  Dg Wrist Complete Left  09/22/2015  CLINICAL DATA:  59 year old female with acute left wrist pain following fall today. Initial encounter. EXAM: LEFT WRIST - COMPLETE 3+ VIEW COMPARISON:  None. FINDINGS: A comminuted distal radial fracture is identified with probable intra-articular extension. There is no evidence of subluxation or dislocation. No other fractures are identified. Soft tissue swelling is present. IMPRESSION: Comminuted distal radial fracture with probable intra-articular extension. No evidence of subluxation or dislocation. Electronically Signed   By: Harmon PierJeffrey  Hu M.D.   On: 09/22/2015 21:31    I have personally reviewed and evaluated these images and lab results as part of my medical decision-making.   EKG Interpretation None      MDM   Final diagnoses:  Left wrist pain  Distal radius fracture, left, closed, with routine healing, subsequent encounter  Nausea    59 y.o. female here with ongoing L wrist pain since she fell on kitchen floor on 10/12. Had comminuted distal radial fx with intraarticular extension at that  time, was told to f/up with ortho but due to having the orange card she went to her PCP for a referral, and the ortho who she was referred to doesn't have appts until January. On exam, swelling and bruising improved, no tenderness to palpation but some pain with supination. NVI with strength intact. FROM intact at all digits. Will reapply a splint that diminishes supination. Pt states she has some nausea from the pain, but declines pain meds here just wants nausea med. Will give zofran ODT and d/c home with zofran and pain meds. Discussed that ultimately she'll need to f/up with  ortho for ongoing care. Doubt need for repeat xray today since it's been less than 4wks, and no focal tenderness anymore, and repeat imaging won't change my plan. I explained the diagnosis and have given explicit precautions to return to the ER including for any other new or worsening symptoms. The patient understands and accepts the medical plan as it's been dictated and I have answered their questions. Discharge instructions concerning home care and prescriptions have been given. The patient is STABLE and is discharged to home in good condition.  BP 120/70 mmHg  Pulse 87  Temp(Src) 98.1 F (36.7 C) (Oral)  Resp 18  SpO2 99%  Meds ordered this encounter  Medications  . ondansetron (ZOFRAN-ODT) disintegrating tablet 8 mg    Sig:   . ondansetron (ZOFRAN) 4 MG tablet    Sig: Take 4 mg by mouth every 8 (eight) hours as needed for nausea or vomiting.  . ondansetron (ZOFRAN) 8 MG tablet    Sig: Take 1 tablet (8 mg total) by mouth every 8 (eight) hours as needed for nausea or vomiting.    Dispense:  10 tablet    Refill:  0    Order Specific Question:  Supervising Provider    Answer:  Hyacinth Meeker, BRIAN [[id number]]  . HYDROcodone-acetaminophen (NORCO) 5-325 MG tablet    Sig: Take 1 tablet by mouth every 6 (six) hours as needed for severe pain.    Dispense:  20 tablet    Refill:  0    Order Specific Question:  Supervising Provider    Answer:  MILLER, BRIAN [[id number]]  . naproxen (NAPROSYN) 500 MG tablet    Sig: Take 1 tablet (500 mg total) by mouth 2 (two) times daily as needed for mild pain or moderate pain (TAKE WITH MEALS.).    Dispense:  20 tablet    Refill:  0    Order Specific Question:  Supervising Provider    Answer:  Eber Hong [[id number]]       Kirti Carl Camprubi-Soms, PA-C 10/15/15 1642  Pricilla Loveless, MD 10/18/15 1526

## 2016-04-12 IMAGING — DX DG WRIST COMPLETE 3+V*L*
4 series · 4 of 4 positions shown · non-contrast
Comparison: None.

CLINICAL DATA: 59-year-old female with acute left wrist pain
following fall today. Initial encounter.

EXAM:
LEFT WRIST - COMPLETE 3+ VIEW

[x wrist pa left]
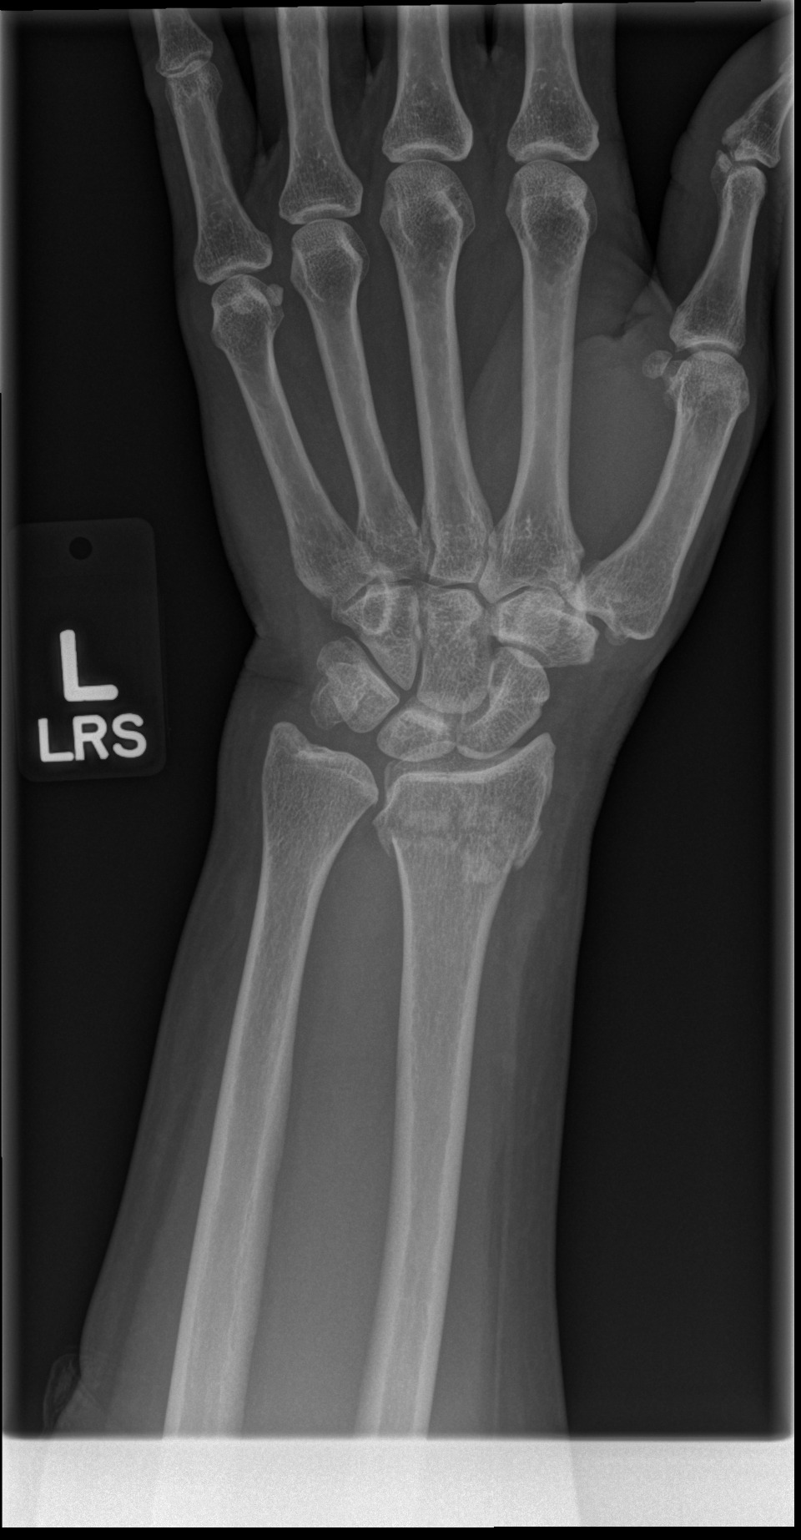

[x wrist obl left]
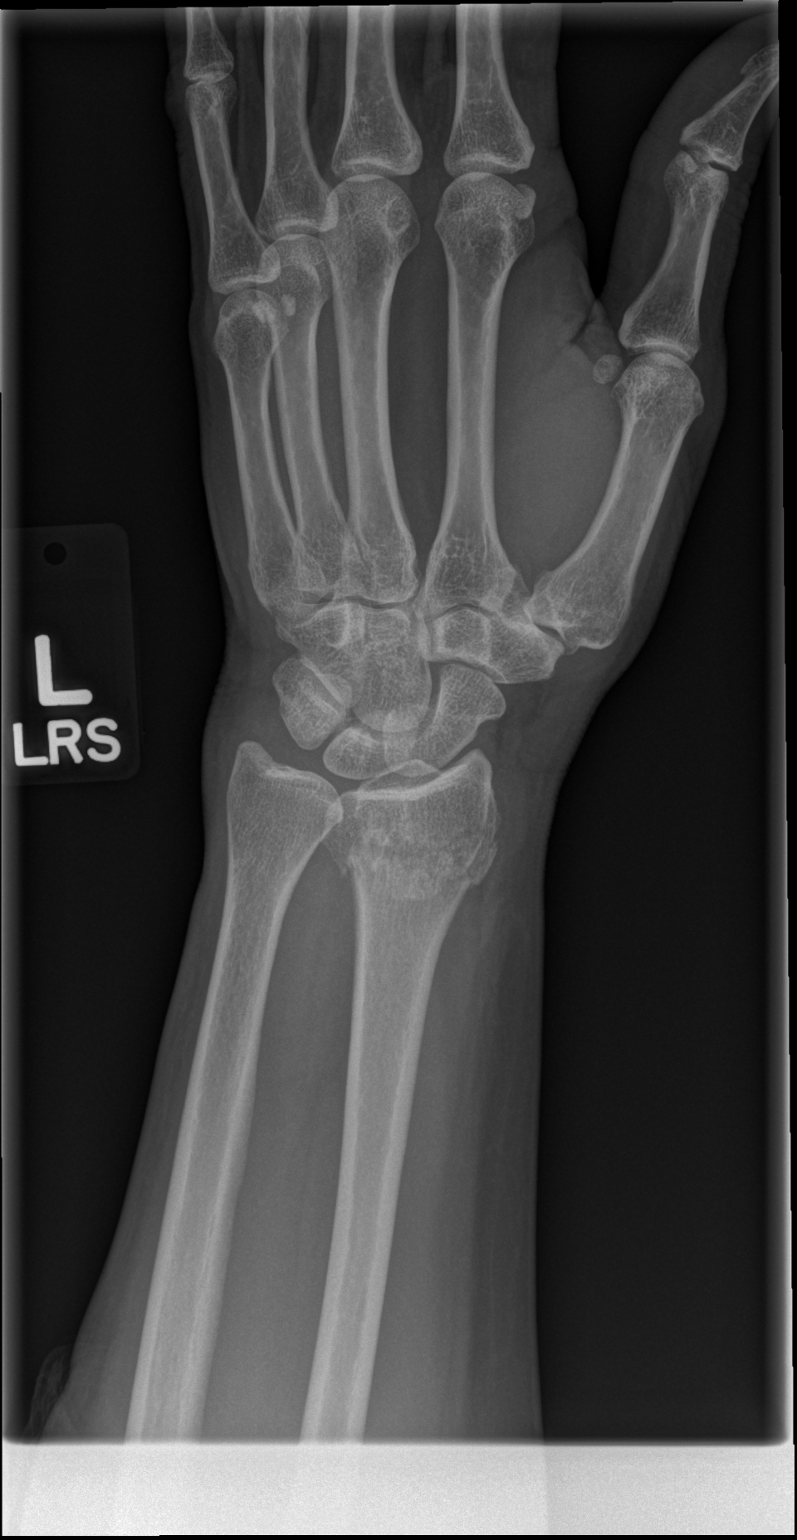

[x wrist lat left]
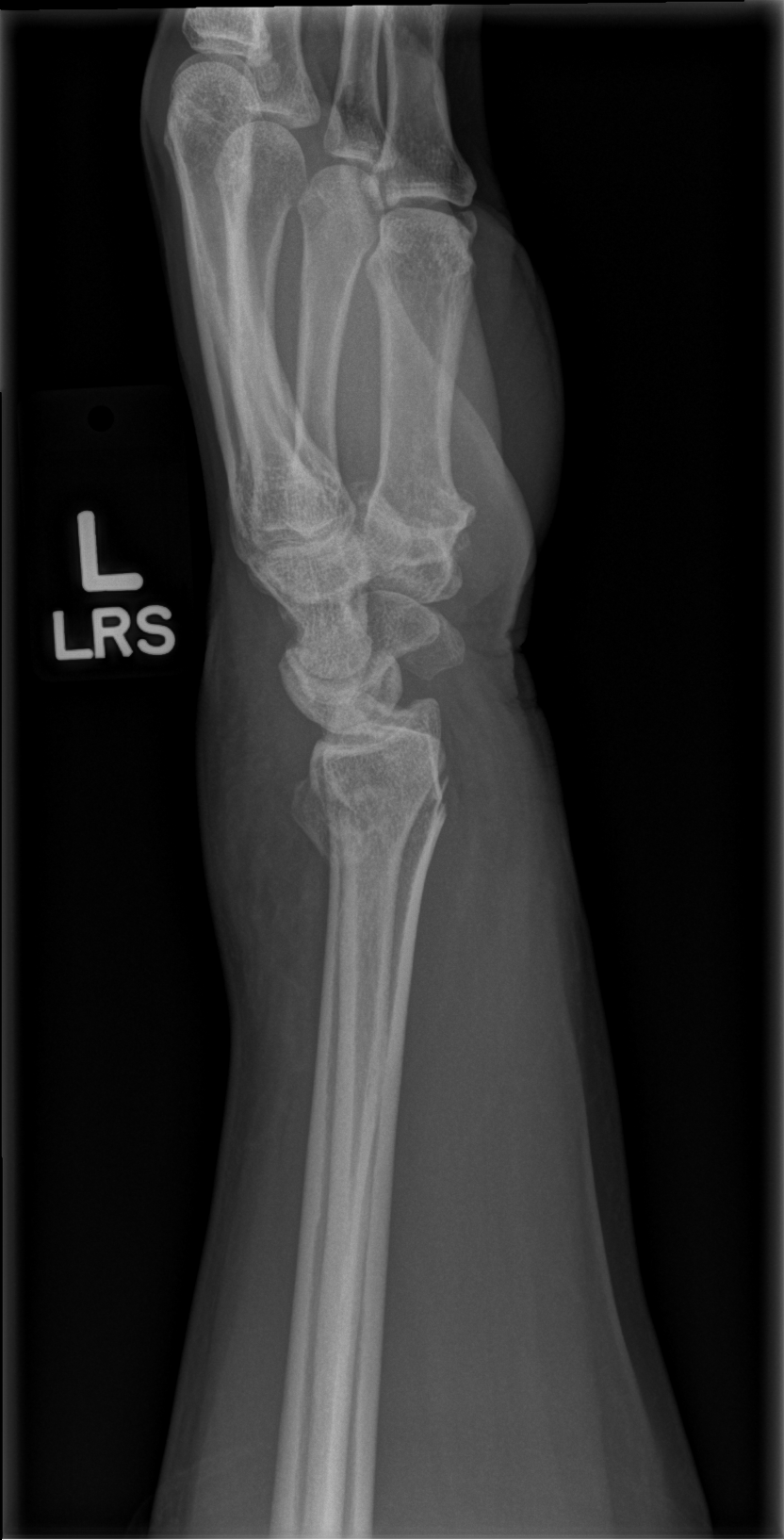

[x wrist navicular view left]
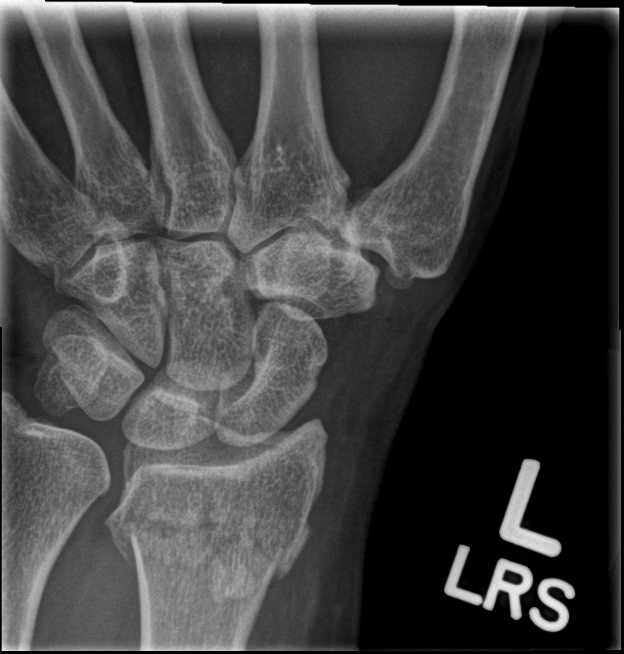

[4 of 4 positions shown; findings below may reference images not displayed]

FINDINGS: A comminuted distal radial fracture is identified with probable
intra-articular extension.

There is no evidence of subluxation or dislocation.

No other fractures are identified.

Soft tissue swelling is present.
IMPRESSION: Comminuted distal radial fracture with probable intra-articular
extension. No evidence of subluxation or dislocation.

## 2020-02-14 ENCOUNTER — Ambulatory Visit: Payer: Self-pay | Attending: Internal Medicine

## 2020-02-14 DIAGNOSIS — Z23 Encounter for immunization: Secondary | ICD-10-CM | POA: Insufficient documentation

## 2020-02-14 NOTE — Progress Notes (Signed)
   Covid-19 Vaccination Clinic  Name:  Haley Wallace    MRN: [id number] DOB: [birth year]  02/14/2020  [name] was observed post Covid-19 immunization for 15 minutes without incident. She was provided with Vaccine Information Sheet and instruction to access the V-Safe system.   [name] was instructed to call 911 with any severe reactions post vaccine: Marland Kitchen Difficulty breathing  . Swelling of face and throat  . A fast heartbeat  . A bad rash all over body  . Dizziness and weakness   Immunizations Administered    Name Date Dose VIS Date Route   Pfizer COVID-19 Vaccine 02/14/2020  2:34 PM 0.3 mL 11/21/2019 Intramuscular   Manufacturer: [employer], [employer]   Lot: [id number]   NDC: [address]-0     This information was provided to patient via language line interpreter. Interpreter ID number of [id number]

## 2020-03-06 ENCOUNTER — Ambulatory Visit: Payer: Self-pay | Attending: Internal Medicine

## 2020-03-06 DIAGNOSIS — Z23 Encounter for immunization: Secondary | ICD-10-CM

## 2020-03-06 NOTE — Progress Notes (Signed)
   Covid-19 Vaccination Clinic  Name:  Haley Wallace    MRN: [id number] DOB: [birth year]  03/06/2020  [name] was observed post Covid-19 immunization for 15 minutes without incident. She was provided with Vaccine Information Sheet and instruction to access the V-Safe system.   [name] was instructed to call 911 with any severe reactions post vaccine: Marland Kitchen Difficulty breathing  . Swelling of face and throat  . A fast heartbeat  . A bad rash all over body  . Dizziness and weakness   Immunizations Administered    Name Date Dose VIS Date Route   Pfizer COVID-19 Vaccine 03/06/2020  3:02 PM 0.3 mL 11/21/2019 Intramuscular   Manufacturer: [employer], [employer]   Lot: [id number]   NDC: [address]-8

## 2020-03-08 ENCOUNTER — Ambulatory Visit: Payer: Self-pay
